# Patient Record
Sex: Female | Born: 1990 | Race: White | Hispanic: No | Marital: Married | Smoking: Never smoker
Health system: Southern US, Community
[De-identification: ages and names within clinical notes are randomized; demographics above are authoritative.]

## PROBLEM LIST (undated history)

## (undated) HISTORY — PX: CHOLECYSTECTOMY: SHX55

## (undated) HISTORY — PX: DENTAL SURGERY: SHX609

---

## 2012-12-14 ENCOUNTER — Emergency Department (INDEPENDENT_AMBULATORY_CARE_PROVIDER_SITE_OTHER)
Admission: EM | Admit: 2012-12-14 | Discharge: 2012-12-14 | Disposition: A | Discharge status: Home / Self Care | Payer: Self-pay | Source: Home / Self Care

## 2012-12-14 ENCOUNTER — Encounter (HOSPITAL_COMMUNITY): Payer: Self-pay | Admitting: Emergency Medicine

## 2012-12-14 DIAGNOSIS — H00039 Abscess of eyelid unspecified eye, unspecified eyelid: Secondary | ICD-10-CM

## 2012-12-14 DIAGNOSIS — H1089 Other conjunctivitis: Secondary | ICD-10-CM

## 2012-12-14 DIAGNOSIS — A499 Bacterial infection, unspecified: Secondary | ICD-10-CM

## 2012-12-14 DIAGNOSIS — H109 Unspecified conjunctivitis: Secondary | ICD-10-CM

## 2012-12-14 DIAGNOSIS — H00033 Abscess of eyelid right eye, unspecified eyelid: Secondary | ICD-10-CM

## 2012-12-14 MED ORDER — TOBRAMYCIN 0.3 % OP SOLN: 1.0000 [drp] | OPHTHALMIC | Status: AC

## 2012-12-14 MED ORDER — CEPHALEXIN 500 MG PO CAPS: 500.0000 mg | ORAL_CAPSULE | Freq: Four times a day (QID) | ORAL | Status: AC

## 2012-12-14 MED ORDER — CEFTRIAXONE SODIUM 1 G IJ SOLR
INTRAMUSCULAR | Status: AC
  Filled 2012-12-14: qty 10

## 2012-12-14 MED ORDER — CEFTRIAXONE SODIUM 1 G IJ SOLR
1.0000 g | Freq: Once | INTRAMUSCULAR | Status: AC
  Administered 2012-12-14: 1 g via INTRAMUSCULAR

## 2012-12-14 MED ORDER — HYDROCODONE-ACETAMINOPHEN 7.5-325 MG PO TABS: 1.0000 | ORAL_TABLET | ORAL | Status: AC | PRN

## 2012-12-14 MED ORDER — LIDOCAINE HCL (PF) 1 % IJ SOLN
INTRAMUSCULAR | Status: AC
  Filled 2012-12-14: qty 5

## 2012-12-14 NOTE — ED Provider Notes (Signed)
Medical screening examination/treatment/procedure(s) were performed by resident physician or non-physician practitioner and as supervising physician I was immediately available for consultation/collaboration.   Suvi Archuletta DOUGLAS MD.   Chrisoula Zegarra D Karleigh Bunte, MD 12/14/12 2029 

## 2012-12-14 NOTE — Discharge Instructions (Signed)
Bacterial Conjunctivitis  Bacterial conjunctivitis (pink eye) is caused by germs. These germs are spread from person to person (contagious). The white part of the eye may look red or pink. The eye may be irritated, watery, or have a thick discharge.   HOME CARE    To ease pain, apply a cool, clean washcloth over closed eyelids. Do this for 10 to 20 minutes, 3 to 4 times a day.   Gently wipe away any fluid coming from the eye with a warm, wet washcloth or cotton ball.   Wash your hands often with soap and water. Use paper towels to dry your hands.   Do not share towels or washcloths.   Change or wash your pillowcase every day.   Do not use eye makeup until the infection is gone.   Do not use machines or drive if your vision is blurry.   Stop using contact lenses. Do not use them again until your doctor says it is okay.   Do not touch the tip of the eye drop bottle or medicine tube with your fingers when you put medicine on the eye.   Use eye drops or medicated cream as told by your doctor.  GET HELP RIGHT AWAY IF:    Your eye is not better after 3 days of starting your medicine.   You have a yellowish fluid coming out of the eye.   You have more pain in the eye.   Your eye redness is spreading.   Your vision becomes blurry.   You have a temperature by mouth above 102 F (38.9 C), or as told by your doctor.   You have pain in the face, redness, or puffiness (swelling) around the eye.   You have problems with medicines you were given.  MAKE SURE YOU:    Understand these instructions.   Will watch this condition.   Will get help right away if you are not doing well or get worse.  Document Released: 05/17/2008 Document Revised: 02/07/2012 Document Reviewed: 05/17/2008  [employer] Patient Information 2013 [employer].

## 2012-12-14 NOTE — ED Notes (Addendum)
Pt c/o upper right eye swelling, below eyebrow, onset yest Sx include pain, localized fever, crusty eyes when she awoke this am.  Denies: f/v/d, inj/trauma.  Also reports having allergies yest Sx include: nasal congestion, runny nose Took antihistamine and most sx were relieved.   She is alert and oriented w/no signs of acute distress.

## 2012-12-14 NOTE — ED Provider Notes (Signed)
History     CSN: [id number]  Arrival date & time 12/14/12  1425   None     Chief Complaint  Patient presents with  . Eye Problem    (Consider location/radiation/quality/duration/timing/severity/associated sxs/prior treatment) HPI Comments: 22 year old female presents with pain in the right upper eyelid with swelling, redness of the conjunctiva in drainage in the eye. This began approximately 2 days ago approximately 3 or 4 days ago she to hair side of her upper eyelid. She denies visual problems.   History reviewed. No pertinent past medical history.  Past Surgical History  Procedure Laterality Date  . Cholecystectomy      No family history on file.  History  Substance Use Topics  . Smoking status: Never Smoker   . Smokeless tobacco: Not on file  . Alcohol Use: No    OB History   Grav Para Term Preterm Abortions TAB SAB Ect Mult Living                  Review of Systems  Constitutional: Negative.   HENT: Negative.   Eyes: Positive for photophobia, pain, discharge and redness. Negative for itching and visual disturbance.       Positive for pain  Respiratory: Negative.   Psychiatric/Behavioral: Negative.     Allergies  Review of patient's allergies indicates no known allergies.  Home Medications   Current Outpatient Rx  Name  Route  Sig  Dispense  Refill  . cephALEXin (KEFLEX) 500 MG capsule   Oral   Take 1 capsule (500 mg total) by mouth 4 (four) times daily. X 8 days   32 capsule   0   . tobramycin (TOBREX) 0.3 % ophthalmic solution   Right Eye   Place 1 drop into the right eye every 4 (four) hours.   5 mL   0     BP 140/95  Pulse 74  Temp(Src) 98.5 F (36.9 C) (Oral)  Resp 18  SpO2 100%  LMP 12/07/2012  Physical Exam  Nursing note and vitals reviewed. Constitutional: She appears well-developed and well-nourished. No distress.  Eyes: EOM are normal. Pupils are equal, round, and reactive to light.  Upper and lower conjunctival are  erythematous. Mild swelling of the lower lid. The upper eyelid with moderate swelling. There is approximately 20 cm of induration beneath the area for which she cut the hair. This area is erythematous. There is no.Harrold Donath Orbital erythema. Positive for purulent drainage. Sclera clear. A foreign body seen with magnification.    ED Course  Procedures (including critical care time)  Labs Reviewed - No data to display No results found.   1. Bacterial conjunctivitis of right eye   2. Cellulitis of eyelid, right       MDM  Warm compresses several times during the day. Tobramycin eye, solution one drop in right eye every 4 hours Keflex 500 mg 4 times a day for 7 days Recheck promptly for any new symptoms problems or worsening. Per any enlargement, increased pain problems with vision increased drainage or size of the palate infection increases recheck promptly. Rocephin 1 gm IM now Norco 7.5 q 4h prn pain  #12  Hayden Rasmussen, NP 12/14/12 6578  Hayden Rasmussen, NP 12/14/12 1726

## 2012-12-15 ENCOUNTER — Telehealth (HOSPITAL_COMMUNITY): Payer: Self-pay | Admitting: *Deleted

## 2012-12-15 NOTE — ED Notes (Signed)
Pt. called on VM and was concerned about diarrhea and her abscess turning darker. She said her instructions told her to seek care immediately if she had either of those symptoms.   I called and left a message to call. Pt. did not call back. I called again and told her if she was concerned she could come back tomorrow for a recheck between 11-5 PM or call back tomorrow. Cherly Anderson M

## 2016-09-15 ENCOUNTER — Encounter (HOSPITAL_COMMUNITY): Payer: Self-pay | Admitting: Emergency Medicine

## 2016-09-15 ENCOUNTER — Emergency Department (HOSPITAL_COMMUNITY): Payer: Commercial Managed Care - PPO

## 2016-09-15 ENCOUNTER — Emergency Department (HOSPITAL_COMMUNITY)
Admission: EM | Admit: 2016-09-15 | Discharge: 2016-09-15 | Disposition: A | Discharge status: Home / Self Care | Payer: Commercial Managed Care - PPO | Attending: Emergency Medicine | Admitting: Emergency Medicine

## 2016-09-15 DIAGNOSIS — Z23 Encounter for immunization: Secondary | ICD-10-CM | POA: Insufficient documentation

## 2016-09-15 DIAGNOSIS — L03116 Cellulitis of left lower limb: Secondary | ICD-10-CM | POA: Insufficient documentation

## 2016-09-15 DIAGNOSIS — S8992XA Unspecified injury of left lower leg, initial encounter: Secondary | ICD-10-CM | POA: Diagnosis present

## 2016-09-15 DIAGNOSIS — S8002XA Contusion of left knee, initial encounter: Secondary | ICD-10-CM | POA: Insufficient documentation

## 2016-09-15 DIAGNOSIS — Y999 Unspecified external cause status: Secondary | ICD-10-CM | POA: Insufficient documentation

## 2016-09-15 DIAGNOSIS — Y939 Activity, unspecified: Secondary | ICD-10-CM | POA: Diagnosis not present

## 2016-09-15 DIAGNOSIS — Y929 Unspecified place or not applicable: Secondary | ICD-10-CM | POA: Diagnosis not present

## 2016-09-15 DIAGNOSIS — W1839XA Other fall on same level, initial encounter: Secondary | ICD-10-CM | POA: Insufficient documentation

## 2016-09-15 MED ORDER — OXYCODONE-ACETAMINOPHEN 5-325 MG PO TABS
1.0000 | ORAL_TABLET | Freq: Once | ORAL | Status: AC
Start: 2016-09-15 — End: 2016-09-15
  Administered 2016-09-15: 1 via ORAL
  Filled 2016-09-15: qty 1

## 2016-09-15 MED ORDER — DOXYCYCLINE HYCLATE 100 MG PO CAPS: 100.0000 mg | ORAL_CAPSULE | Freq: Two times a day (BID) | ORAL | 0 refills | Status: AC

## 2016-09-15 MED ORDER — TETANUS-DIPHTH-ACELL PERTUSSIS 5-2.5-18.5 LF-MCG/0.5 IM SUSP
0.5000 mL | Freq: Once | INTRAMUSCULAR | Status: AC
  Administered 2016-09-15: 0.5 mL via INTRAMUSCULAR
  Filled 2016-09-15: qty 0.5

## 2016-09-15 MED ORDER — NAPROXEN 500 MG PO TABS: 500.0000 mg | ORAL_TABLET | Freq: Two times a day (BID) | ORAL | 0 refills | Status: AC | PRN

## 2016-09-15 MED ORDER — OXYCODONE-ACETAMINOPHEN 5-325 MG PO TABS: 1.0000 | ORAL_TABLET | Freq: Four times a day (QID) | ORAL | 0 refills | Status: AC | PRN

## 2016-09-15 NOTE — Discharge Instructions (Signed)
Wear knee sleeve for at least 2 weeks for stabilization of knee. Use crutches as needed for comfort. Ice and elevate knee throughout the day, using ice pack for no more than 20 minutes every hour. Alternate between naprosyn and percocet for pain relief. Do not drive or operate machinery with pain medication use. Start taking antibiotic as directed and until completed. Follow up with the orthopedist in 5-7 days for recheck of symptoms and ongoing management of your knee injury. Return to the ER for changes or worsening symptoms.  Keep wound clean with mild soap and water. Keep area covered with a topical antibiotic ointment and bandage, keep bandage dry. Monitor area for signs of infection to include, but not limited to: increasing pain, spreading redness, drainage/pus, worsening swelling, or fevers. Return to emergency department for emergent changing or worsening symptoms.

## 2016-09-15 NOTE — ED Provider Notes (Signed)
[facility]-EMERGENCY DEPT Provider Note   CSN: [id number] Arrival date & time: 09/15/16  0023     History   Chief Complaint Chief Complaint  Patient presents with  . Knee Injury    HPI Caroline Molina is a 26 y.o. female, who presents to the ED with complaints of L knee pain after a mechanical fall 5 days ago. Patient states that she tripped and slipped on ice 5 days ago causing her to strike her left knee on a rock, she had some soreness in her knee since then but was able to ambulate, but today her pain suddenly worsened when she got up from her desk. She describes the pain as more 8/10 intermittent sharp nonradiating left knee pain worse with movement and ambulation and unrelieved with ice, elevation, and Aleve. She also reports a bruise, mild swelling, and a puncture wound/abrasion over the left knee. She has noticed some serous drainage from the wound when she presses it, as well as some erythema that developed today around the wound. She is unsure of her last tetanus shot. She denies red streaking, purulent drainage, warmth to knee, fevers, chills, CP, SOB, abd pain, N/V/D/C, hematuria, dysuria, myalgias, back or neck pain, head inj/LOC, numbness, tingling, weakness, or any other complaints at this time. Denies possibility of pregnancy.    The history is provided by the patient and medical records. No language interpreter was used.  Knee Pain   This is a new problem. The current episode started more than 2 days ago. The problem occurs daily. The problem has been gradually worsening. The pain is present in the left knee. The quality of the pain is described as sharp. The pain is at a severity of 8/10. The pain is moderate. Pertinent negatives include no numbness, full range of motion and no tingling. The symptoms are aggravated by standing and activity. She has tried OTC pain medications and cold for the symptoms. The treatment provided no relief. There has been a history of trauma.     History reviewed. No pertinent past medical history.  There are no active problems to display for this patient.   Past Surgical History:  Procedure Laterality Date  . CHOLECYSTECTOMY    . DENTAL SURGERY      OB History    No data available       Home Medications    Prior to Admission medications   Medication Sig Start Date End Date Taking? Authorizing Provider  cephALEXin (KEFLEX) 500 MG capsule Take 1 capsule (500 mg total) by mouth 4 (four) times daily. X 8 days 12/14/12   Hayden Rasmussenavid Mabe, NP  HYDROcodone-acetaminophen (NORCO) 7.5-325 MG per tablet Take 1 tablet by mouth every 4 (four) hours as needed for pain. 12/14/12   Hayden Rasmussenavid Mabe, NP  tobramycin (TOBREX) 0.3 % ophthalmic solution Place 1 drop into the right eye every 4 (four) hours. 12/14/12   Hayden Rasmussenavid Mabe, NP    Family History No family history on file.  Social History Social History  Substance Use Topics  . Smoking status: Never Smoker  . Smokeless tobacco: Not on file  . Alcohol use No     Allergies   Penicillins and Vicodin [hydrocodone-acetaminophen]   Review of Systems Review of Systems  Constitutional: Negative for chills and fever.  HENT: Negative for facial swelling (no head inj).   Respiratory: Negative for shortness of breath.   Cardiovascular: Negative for chest pain.  Gastrointestinal: Negative for abdominal pain, constipation, diarrhea, nausea and vomiting.  Genitourinary: Negative for  dysuria and hematuria.  Musculoskeletal: Positive for arthralgias and joint swelling. Negative for back pain, myalgias and neck pain.  Skin: Positive for color change and wound.  Allergic/Immunologic: Negative for immunocompromised state.  Neurological: Negative for tingling, syncope, weakness and numbness.  Psychiatric/Behavioral: Negative for confusion.   10 Systems reviewed and are negative for acute change except as noted in the HPI.   Physical Exam Updated Vital Signs BP 131/74 (BP Location: Left Arm)    Pulse 74   Temp 98.3 F (36.8 C) (Oral)   Resp 16   Ht 5\' 6"  (1.676 m)   Wt 111.1 kg   LMP 09/09/2016   SpO2 100%   BMI 39.54 kg/m   Physical Exam  Constitutional: She is oriented to person, place, and time. Vital signs are normal. She appears well-developed and well-nourished.  Non-toxic appearance. No distress.  Afebrile, nontoxic, NAD  HENT:  Head: Normocephalic and atraumatic.  Mouth/Throat: Mucous membranes are normal.  Eyes: Conjunctivae and EOM are normal. Right eye exhibits no discharge. Left eye exhibits no discharge.  Neck: Normal range of motion. Neck supple.  Cardiovascular: Normal rate and intact distal pulses.   Pulmonary/Chest: Effort normal. No respiratory distress.  Abdominal: Normal appearance. She exhibits no distension.  Musculoskeletal:       Left knee: She exhibits swelling, ecchymosis, laceration (abrasion/puncture wound) and erythema. She exhibits normal range of motion, no effusion, no deformity, normal alignment, no LCL laxity, normal patellar mobility and no MCL laxity. Tenderness found. Medial joint line and lateral joint line tenderness noted.  L knee with FROM intact, with mild diffuse joint line TTP, small puncture wound to suprapatellar area with some mild swelling around the wound, mild erythema without warmth around wound, scant serous drainage expressible from wound but without purulent drainage. No effusion/deformity, small bruise to just below patella, no warmth, no abnormal alignment or patellar mobility, no varus/valgus laxity, neg anterior drawer test, no crepitus.  Strength and sensation grossly intact, distal pulses intact, compartments soft   Neurological: She is alert and oriented to person, place, and time. She has normal strength. No sensory deficit.  Skin: Skin is warm and dry. Abrasion and bruising noted. No rash noted.  Bruise and abrasion/puncture wound to L knee as mentioned above  Psychiatric: She has a normal mood and affect. Her  behavior is normal.  Nursing note and vitals reviewed.    ED Treatments / Results  Labs (all labs ordered are listed, but only abnormal results are displayed) Labs Reviewed - No data to display  EKG  EKG Interpretation None       Radiology Dg Knee Complete 4 Views Left  Result Date: 09/15/2016 CLINICAL DATA:  Initial evaluation for acute anterior knee pain status post fall 3 days ago. EXAM: LEFT KNEE - COMPLETE 4+ VIEW COMPARISON:  None. FINDINGS: No evidence of fracture, dislocation, or joint effusion. No evidence of arthropathy or other focal bone abnormality. Soft tissues are unremarkable. IMPRESSION: No acute osseous abnormality about the knee. Electronically Signed   By: Rise Mu M.D.   On: 09/15/2016 01:52    Procedures Procedures (including critical care time)  Medications Ordered in ED Medications  oxyCODONE-acetaminophen (PERCOCET/ROXICET) 5-325 MG per tablet 1 tablet (1 tablet Oral Given 09/15/16 0324)  Tdap (BOOSTRIX) injection 0.5 mL (0.5 mLs Intramuscular Given 09/15/16 0358)     Initial Impression / Assessment and Plan / ED Course  I have reviewed the triage vital signs and the nursing notes.  Pertinent labs & imaging results  that were available during my care of the patient were reviewed by me and considered in my medical decision making (see chart for details).     26 y.o. female here with L knee pain after mechanical fall 5 days ago; punctured knee on rock. Worse pain that began today, although she admits she was sore after the incident; ambulatory since incident. Noticed redness, swelling, and serous fluid drainage from puncture wound over knee. No red streaking, warmth, or fevers. On exam, NVI with soft compartments, FROM intact, mild tenderness diffusely throughout joint line, mild swelling near the puncture wound, serous drainage expressible without purulent drainage, minimally erythematous around wound, no warmth. Xray neg. Doesn't seem like  septic joint, more likely developing cellulitis around the wound on knee, but doesn't seem to actually go down to joint. Small bruise to knee, likely contusion. Will apply knee sleeve, give crutches; RICE encouraged; will start on doxy to cover for infection, strict return precautions advised. Will update Tdap. Will give pain meds as well; [place] reviewed prior to dispensing controlled substance medications, and was notable for: no narcotics/controlled substances in last 6 months. Risks/benefits/alternatives and expectations discussed regarding controlled substances. Side effects of medications discussed. Informed consent obtained. Discussed ortho f/up in 5-7 days for recheck. I explained the diagnosis and have given explicit precautions to return to the ER including for any other new or worsening symptoms. The patient understands and accepts the medical plan as it's been dictated and I have answered their questions. Discharge instructions concerning home care and prescriptions have been given. The patient is STABLE and is discharged to home in good condition.   Final Clinical Impressions(s) / ED Diagnoses   Final diagnoses:  Contusion of left knee, initial encounter  Cellulitis of left lower extremity    New Prescriptions New Prescriptions   DOXYCYCLINE (VIBRAMYCIN) 100 MG CAPSULE    Take 1 capsule (100 mg total) by mouth 2 (two) times daily. One po bid x 7 days   NAPROXEN (NAPROSYN) 500 MG TABLET    Take 1 tablet (500 mg total) by mouth 2 (two) times daily as needed for mild pain, moderate pain or headache (TAKE WITH MEALS.).   OXYCODONE-ACETAMINOPHEN (PERCOCET) 5-325 MG TABLET    Take 1 tablet by mouth every 6 (six) hours as needed for severe pain.       [id number] PA-C 09/15/16 0424    Cy Blamer, MD 09/15/16 [phone]

## 2016-09-15 NOTE — ED Triage Notes (Addendum)
Pt from home with complaints of left knee pain following falling hiking 5 days ago. Pt is ambulatory. Pt states her pain is 8/10 and feels like "jelly". Pt has slight swelling to the area and an area where the skin was broken. No deformity noted

## 2018-05-21 ENCOUNTER — Encounter (HOSPITAL_BASED_OUTPATIENT_CLINIC_OR_DEPARTMENT_OTHER): Payer: Self-pay

## 2018-05-21 ENCOUNTER — Emergency Department (HOSPITAL_BASED_OUTPATIENT_CLINIC_OR_DEPARTMENT_OTHER)
Admission: EM | Admit: 2018-05-21 | Discharge: 2018-05-21 | Disposition: A | Discharge status: Home / Self Care | Payer: Commercial Managed Care - PPO | Attending: Emergency Medicine | Admitting: Emergency Medicine

## 2018-05-21 ENCOUNTER — Emergency Department (HOSPITAL_BASED_OUTPATIENT_CLINIC_OR_DEPARTMENT_OTHER): Payer: Commercial Managed Care - PPO

## 2018-05-21 DIAGNOSIS — R1011 Right upper quadrant pain: Secondary | ICD-10-CM

## 2018-05-21 DIAGNOSIS — R1032 Left lower quadrant pain: Secondary | ICD-10-CM | POA: Diagnosis not present

## 2018-05-21 DIAGNOSIS — R109 Unspecified abdominal pain: Secondary | ICD-10-CM | POA: Diagnosis present

## 2018-05-21 LAB — URINALYSIS, MICROSCOPIC (REFLEX)

## 2018-05-21 LAB — CBC
HCT: 39 % (ref 36.0–46.0)
HEMOGLOBIN: 13.3 g/dL (ref 12.0–15.0)
MCH: 30.7 pg (ref 26.0–34.0)
MCHC: 34.1 g/dL (ref 30.0–36.0)
MCV: 90.1 fL (ref 78.0–100.0)
PLATELETS: 348 10*3/uL (ref 150–400)
RBC: 4.33 MIL/uL (ref 3.87–5.11)
RDW: 12.5 % (ref 11.5–15.5)
WBC: 12.9 10*3/uL — ABNORMAL HIGH (ref 4.0–10.5)

## 2018-05-21 LAB — COMPREHENSIVE METABOLIC PANEL
ALBUMIN: 3.5 g/dL (ref 3.5–5.0)
ALK PHOS: 86 U/L (ref 38–126)
ALT: 18 U/L (ref 0–44)
ANION GAP: 10 (ref 5–15)
AST: 18 U/L (ref 15–41)
BUN: 7 mg/dL (ref 6–20)
CHLORIDE: 102 mmol/L (ref 98–111)
CO2: 25 mmol/L (ref 22–32)
Calcium: 8.8 mg/dL — ABNORMAL LOW (ref 8.9–10.3)
Creatinine, Ser: 0.7 mg/dL (ref 0.44–1.00)
GFR calc non Af Amer: >60 mL/min (ref >60)
GLUCOSE: 96 mg/dL (ref 70–99)
POTASSIUM: 3.9 mmol/L (ref 3.5–5.1)
SODIUM: 137 mmol/L (ref 135–145)
Total Bilirubin: 0.9 mg/dL (ref 0.3–1.2)
Total Protein: 7.3 g/dL (ref 6.5–8.1)

## 2018-05-21 LAB — URINALYSIS, ROUTINE W REFLEX MICROSCOPIC
BILIRUBIN URINE: NEGATIVE
GLUCOSE, UA: NEGATIVE mg/dL
KETONES UR: NEGATIVE mg/dL
Nitrite: NEGATIVE
PH: 6.5 (ref 5.0–8.0)
Protein, ur: NEGATIVE mg/dL

## 2018-05-21 LAB — PREGNANCY, URINE: PREG TEST UR: NEGATIVE

## 2018-05-21 LAB — WET PREP, GENITAL
Sperm: NONE SEEN
TRICH WET PREP: NONE SEEN
YEAST WET PREP: NONE SEEN

## 2018-05-21 LAB — LIPASE, BLOOD: Lipase: 24 U/L (ref 11–51)

## 2018-05-21 MED ORDER — KETOROLAC TROMETHAMINE 15 MG/ML IJ SOLN
15.0000 mg | Freq: Once | INTRAMUSCULAR | Status: AC
  Administered 2018-05-21: 15 mg via INTRAVENOUS
  Filled 2018-05-21: qty 1

## 2018-05-21 MED ORDER — DICYCLOMINE HCL 20 MG PO TABS: 20.0000 mg | ORAL_TABLET | Freq: Two times a day (BID) | ORAL | 0 refills | Status: AC

## 2018-05-21 MED ORDER — SODIUM CHLORIDE 0.9 % IV BOLUS
1000.0000 mL | Freq: Once | INTRAVENOUS | Status: AC
  Administered 2018-05-21: 1000 mL via INTRAVENOUS

## 2018-05-21 MED ORDER — FENTANYL CITRATE (PF) 100 MCG/2ML IJ SOLN
50.0000 ug | Freq: Once | INTRAMUSCULAR | Status: AC
  Administered 2018-05-21: 50 ug via INTRAVENOUS
  Filled 2018-05-21: qty 2

## 2018-05-21 MED ORDER — METRONIDAZOLE 500 MG PO TABS: 500.0000 mg | ORAL_TABLET | Freq: Two times a day (BID) | ORAL | 0 refills | Status: AC

## 2018-05-21 MED ORDER — FENTANYL CITRATE (PF) 100 MCG/2ML IJ SOLN
25.0000 ug | Freq: Once | INTRAMUSCULAR | Status: AC
  Administered 2018-05-21: 25 ug via INTRAVENOUS
  Filled 2018-05-21: qty 2

## 2018-05-21 MED ORDER — IOPAMIDOL (ISOVUE-300) INJECTION 61%
100.0000 mL | Freq: Once | INTRAVENOUS | Status: AC | PRN
  Administered 2018-05-21: 100 mL via INTRAVENOUS

## 2018-05-21 NOTE — ED Triage Notes (Signed)
Pt states no solid bm in 2weeks, having RUQ pain and LLQ pain; sent here from [facility]

## 2018-05-21 NOTE — ED Notes (Signed)
Pt verbalizes understanding of d/c instructions and denies any further needs at this time. 

## 2018-05-21 NOTE — ED Provider Notes (Signed)
[facility] Emergency Department Provider Note MRN:  [id number]  Arrival date & time: 05/21/18     Chief Complaint   Abdominal Pain   History of Present Illness   Caroline Molina is a 27 y.o. year-old female with a history of cholecystectomy presenting to the ED with chief complaint of abdominal pain.  Pain began gradually 2 weeks ago, located in the right upper quadrant and left lower quadrant.  Pain became more severe 3 to 4 days ago.  910 severity, constant, associated with loose stools.  Denies fever, no chest pain or shortness of breath.  No exacerbating or relieving factors.  Review of Systems  A complete 10 system review of systems was obtained and all systems are negative except as noted in the HPI and PMH.   Patient's Health History   History reviewed. No pertinent past medical history.  Past Surgical History:  Procedure Laterality Date  . CHOLECYSTECTOMY    . DENTAL SURGERY      No family history on file.  Social History   Socioeconomic History  . Marital status: Married    Spouse name: Not on file  . Number of children: Not on file  . Years of education: Not on file  . Highest education level: Not on file  Occupational History  . Not on file  Social Needs  . Financial resource strain: Not on file  . Food insecurity:    Worry: Not on file    Inability: Not on file  . Transportation needs:    Medical: Not on file    Non-medical: Not on file  Tobacco Use  . Smoking status: Never Smoker  . Smokeless tobacco: Never Used  Substance and Sexual Activity  . Alcohol use: No  . Drug use: No  . Sexual activity: Yes    Birth control/protection: Condom  Lifestyle  . Physical activity:    Days per week: Not on file    Minutes per session: Not on file  . Stress: Not on file  Relationships  . Social connections:    Talks on phone: Not on file    Gets together: Not on file    Attends religious service: Not on file    Active member of  club or organization: Not on file    Attends meetings of clubs or organizations: Not on file    Relationship status: Not on file  . Intimate partner violence:    Fear of current or ex partner: Not on file    Emotionally abused: Not on file    Physically abused: Not on file    Forced sexual activity: Not on file  Other Topics Concern  . Not on file  Social History Narrative  . Not on file     Physical Exam  Vital Signs and Nursing Notes reviewed Vitals:   05/21/18 1012 05/21/18 1443  BP: 113/79 127/84  Pulse: 77 76  Resp: 18 16  Temp: 98.2 F (36.8 C) 99.2 F (37.3 C)  SpO2: 99% 100%    CONSTITUTIONAL: Well-appearing, NAD NEURO:  Alert and oriented x 3, no focal deficits EYES:  eyes equal and reactive ENT/NECK:  no LAD, no JVD CARDIO: Regular rate, well-perfused, normal S1 and S2 PULM:  CTAB no wheezing or rhonchi GI/GU:  normal bowel sounds, non-distended, moderate left lower quadrant tenderness to palpation; normal-appearing external genitalia, no adnexal masses, no cervical motion tenderness, moderate amount of white/yellow discharge in the vaginal vault.  Chaperoned by female nurse. MSK/SPINE:  No gross deformities, no edema SKIN:  no rash, atraumatic PSYCH:  Appropriate speech and behavior  Diagnostic and Interventional Summary    EKG Interpretation  Date/Time:    Ventricular Rate:    PR Interval:    QRS Duration:   QT Interval:    QTC Calculation:   R Axis:     Text Interpretation:        Labs Reviewed  WET PREP, GENITAL - Abnormal; Notable for the following components:      Result Value   Clue Cells Wet Prep HPF POC PRESENT (*)    WBC, Wet Prep HPF POC MANY (*)    All other components within normal limits  CBC - Abnormal; Notable for the following components:   WBC 12.9 (*)    All other components within normal limits  COMPREHENSIVE METABOLIC PANEL - Abnormal; Notable for the following components:   Calcium 8.8 (*)    All other components within  normal limits  URINALYSIS, ROUTINE W REFLEX MICROSCOPIC - Abnormal; Notable for the following components:   APPearance HAZY (*)    Specific Gravity, Urine <1.005 (*)    Hgb urine dipstick TRACE (*)    Leukocytes, UA SMALL (*)    All other components within normal limits  URINALYSIS, MICROSCOPIC (REFLEX) - Abnormal; Notable for the following components:   Bacteria, UA MANY (*)    All other components within normal limits  LIPASE, BLOOD  PREGNANCY, URINE  GC/CHLAMYDIA PROBE AMP ([place]) NOT AT [facility]    DG Chest 2 View  Final Result    CT ABDOMEN PELVIS W CONTRAST  Final Result      Medications  sodium chloride 0.9 % bolus 1,000 mL (0 mLs Intravenous Stopped 05/21/18 1320)  fentaNYL (SUBLIMAZE) injection 25 mcg (25 mcg Intravenous Given 05/21/18 1220)  iopamidol (ISOVUE-300) 61 % injection 100 mL (100 mLs Intravenous Contrast Given 05/21/18 1311)  fentaNYL (SUBLIMAZE) injection 50 mcg (50 mcg Intravenous Given 05/21/18 1447)  ketorolac (TORADOL) 15 MG/ML injection 15 mg (15 mg Intravenous Given 05/21/18 1447)     Procedures Critical Care  ED Course and Medical Decision Making  I have reviewed the triage vital signs and the nursing notes.  Pertinent labs & imaging results that were available during my care of the patient were reviewed by me and considered in my medical decision making (see below for details).    Favoring constipation versus diverticulitis in this 27 year old female with recent worsening left lower quadrant pain, change to bowel habits.  No fevers, history and physical inconsistent with ovarian torsion, no vaginal bleeding or discharge.  CT pending.  CT with nonspecific findings, question of enteritis.  Some of free fluid that is also nonspecific.  Pelvic exam with nothing to suggest PID, no tenderness, moderate amount of discharge.  Wet prep clue cells suggesting bacterial vaginosis.  Will treat with metronidazole, also prescription for Bentyl for pain.  Provided  reassurance.  Patient also endorsing continued right upper quadrant pain.  Without explanation on CT, chest x-ray was also performed which was unremarkable.  Patient is without tachycardia, no evidence of DVT, PERC negative.  After the discussed management above, the patient was determined to be safe for discharge.  The patient was in agreement with this plan and all questions regarding their care were answered.  ED return precautions were discussed and the patient will return to the ED with any significant worsening of condition.  Elmer Sow. Pilar Plate, MD [facility]  [email or web address]  Final Clinical Impressions(s) / ED Diagnoses     ICD-10-CM   1. Left lower quadrant pain R10.32   2. RUQ pain R10.11 DG Chest 2 View    DG Chest 2 View    ED Discharge Orders         Ordered    dicyclomine (BENTYL) 20 MG tablet  2 times daily     05/21/18 1609    metroNIDAZOLE (FLAGYL) 500 MG tablet  2 times daily     05/21/18 1609             Sabas Sous, MD 05/21/18 [id number]

## 2018-05-21 NOTE — Discharge Instructions (Signed)
You were evaluated in the Emergency Department and after careful evaluation, we did not find any emergent condition requiring admission or further testing in the hospital.  Your symptoms today seem to be due to bacterial vaginosis.  You may also have inflammation of your intestines due to a virus.  Please take the medications provided as directed and follow-up with your regular doctor.  Please return to the Emergency Department if you experience any worsening of your condition.  We encourage you to follow up with a primary care provider.  Thank you for allowing [place] to be a part of your care.

## 2018-05-22 LAB — GC/CHLAMYDIA PROBE AMP (~~LOC~~) NOT AT ARMC
CHLAMYDIA, DNA PROBE: POSITIVE — AB
NEISSERIA GONORRHEA: NEGATIVE

## 2018-05-24 ENCOUNTER — Telehealth: Payer: Self-pay | Admitting: Medical

## 2018-05-24 DIAGNOSIS — A749 Chlamydial infection, unspecified: Secondary | ICD-10-CM

## 2018-05-24 MED ORDER — AZITHROMYCIN 250 MG PO TABS: 1000.0000 mg | ORAL_TABLET | Freq: Once | ORAL | 0 refills | Status: AC

## 2018-05-24 NOTE — Telephone Encounter (Addendum)
Caroline Molina tested positive for  Chlamydia. Patient was called by RN and allergies and pharmacy confirmed. Rx sent to pharmacy of choice.   Marny Lowenstein, PA-C 05/24/2018 10:28 AM       ----- Message from Kathe Becton, RN sent at 05/24/2018 10:12 AM EDT ----- This patient tested positive for :  chlamydia  She,"is allergic to Penicillin and Vicodin" I have informed the patient of her results and confirmed her pharmacy is correct in her chart. Please send Rx.   Thank you,   Kathe Becton, RN   Results faxed to [facility] Department.

## 2019-06-21 IMAGING — CT CT ABD-PELV W/ CM
2 of 4 series · 17 of 46 positions shown, 19 images · IV contrast (APPLIED)
Comparison: None.

CLINICAL DATA: Abdominal pain with diverticulitis suspected

EXAM:
CT ABDOMEN AND PELVIS WITH CONTRAST
TECHNIQUE: Multidetector CT imaging of the abdomen and pelvis was performed
using the standard protocol following bolus administration of
intravenous contrast.
CONTRAST:  100mL [id number] IOPAMIDOL (WT09HN-3WW) INJECTION 61%

[Series 2: axial st · axial · 0.79mm/px · z∈[-518,-58]mm · 14 of 100 slices shown, 16 images]
[im 4/100  soft-tissue]
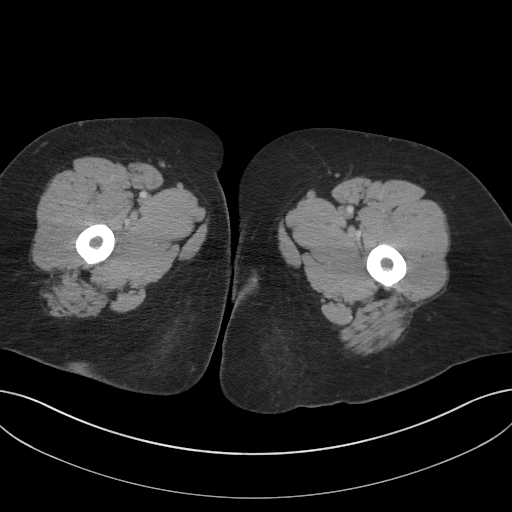
[im 4/100  bone]
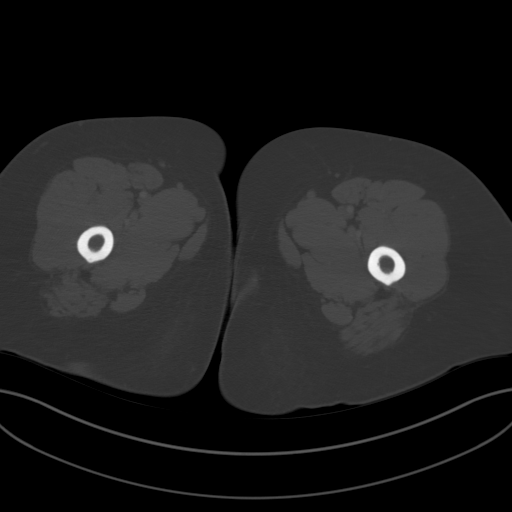
[im 12/100  soft-tissue]
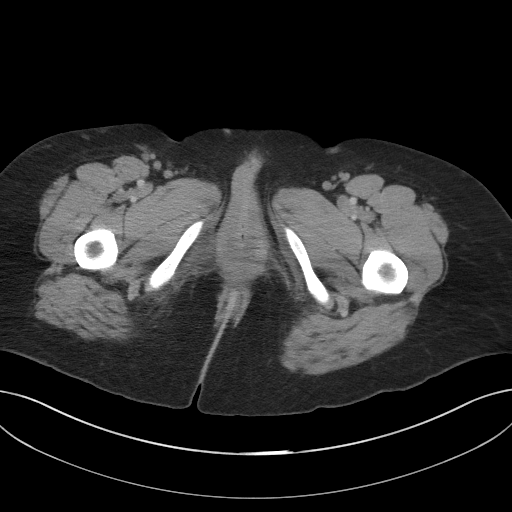
[im 20/100  soft-tissue]
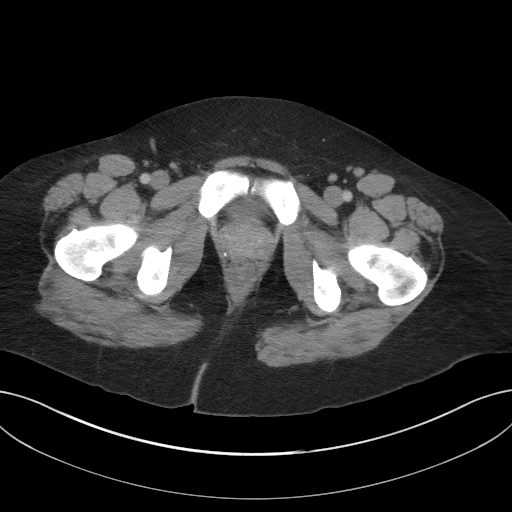
[im 27/100  soft-tissue]
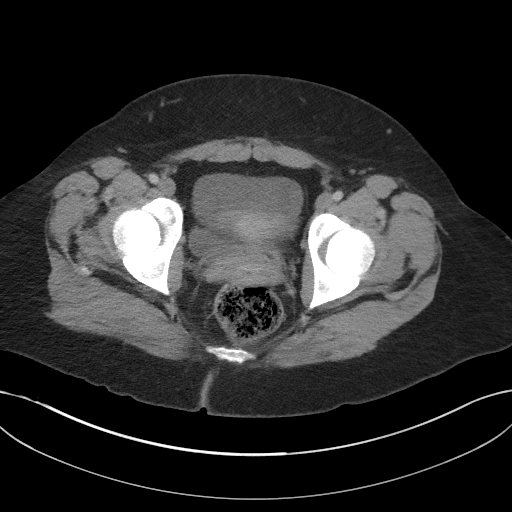
[im 35/100  soft-tissue]
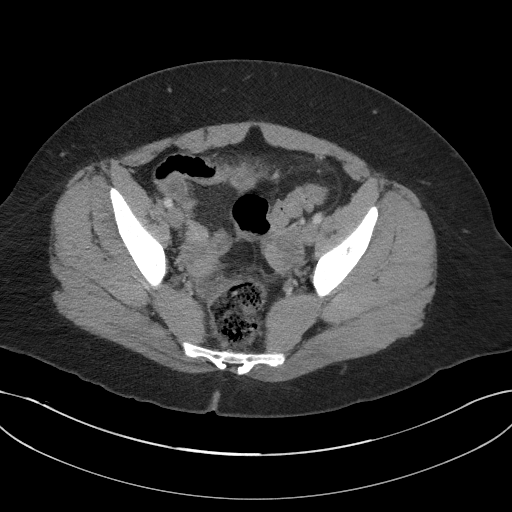
[im 39/100  soft-tissue]
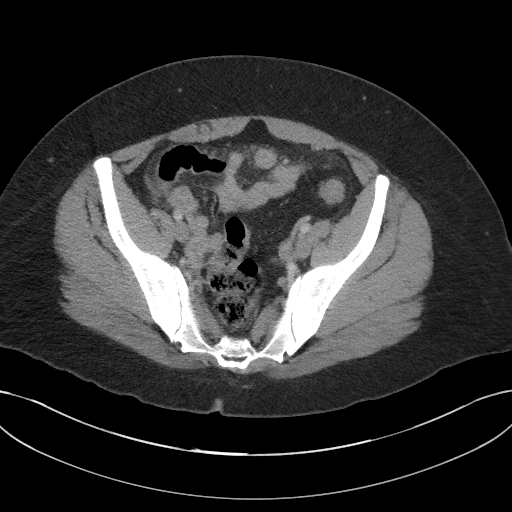
[im 46/100  soft-tissue]
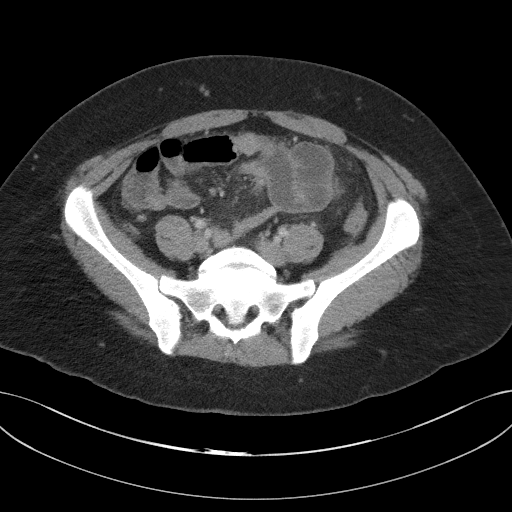
[im 54/100  soft-tissue]
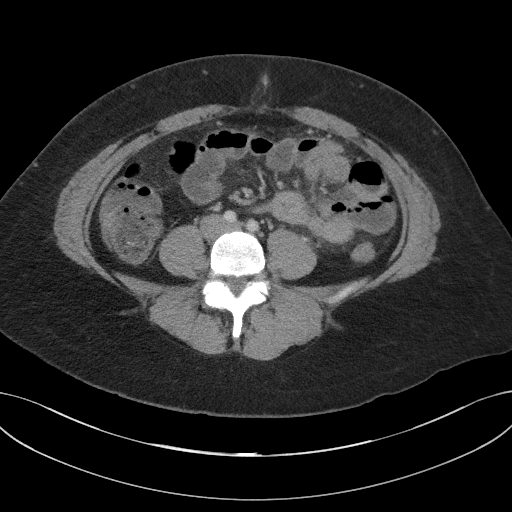
[im 61/100  soft-tissue]
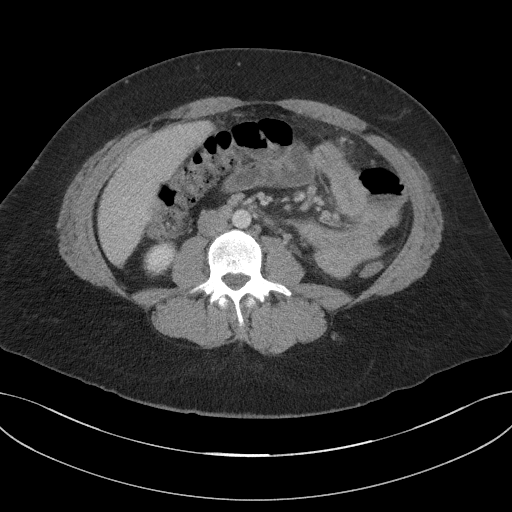
[im 61/100  bone]
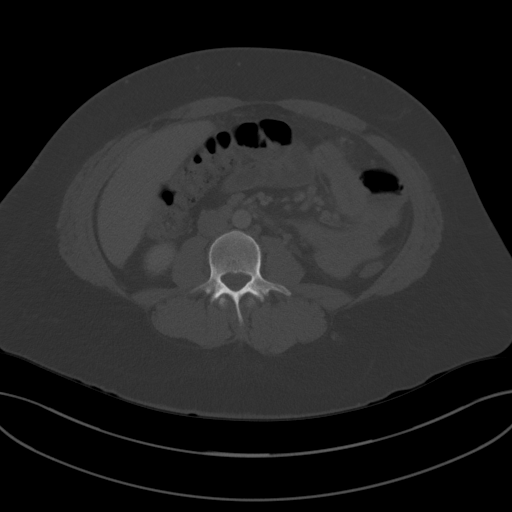
[im 65/100  soft-tissue]
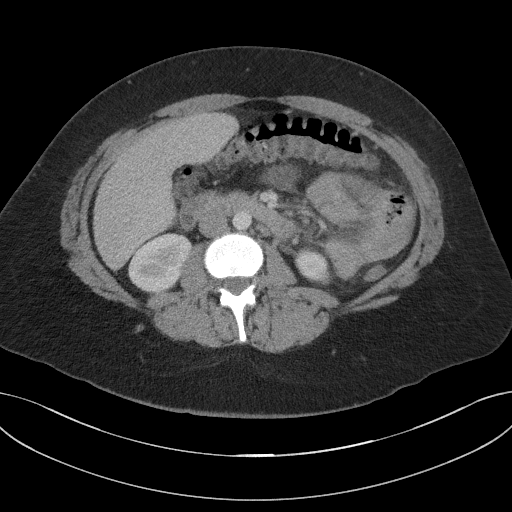
[im 73/100  soft-tissue]
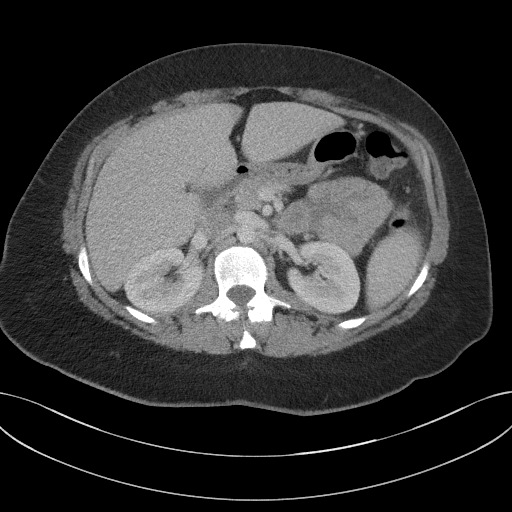
[im 80/100  soft-tissue]
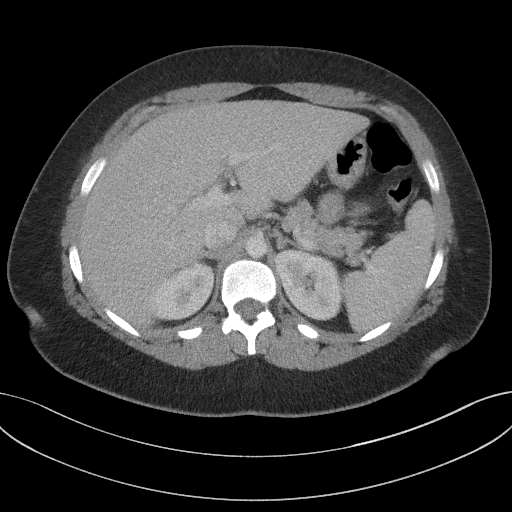
[im 88/100  soft-tissue]
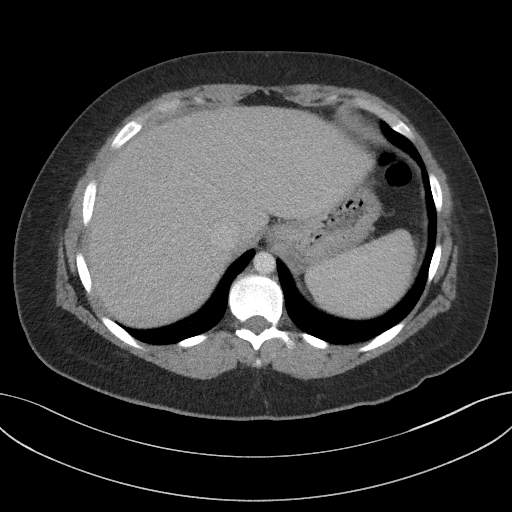
[im 96/100  soft-tissue]
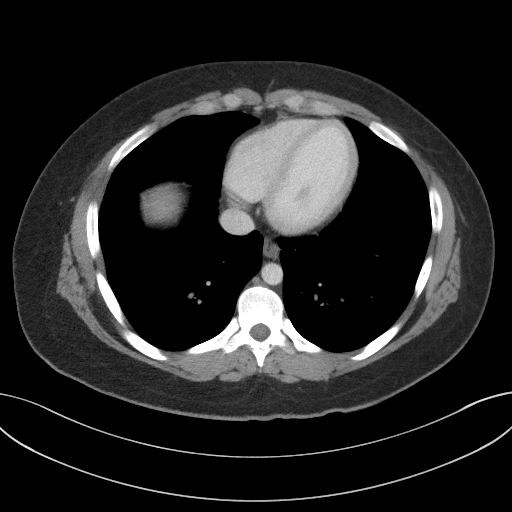

[Series 4: coronal st · coronal · 0.93mm/px · 3 of 108 slices shown]
[im 36/108  soft-tissue]
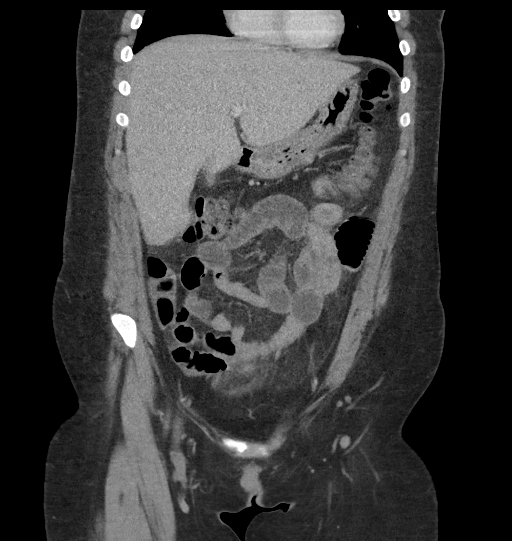
[im 48/108  soft-tissue]
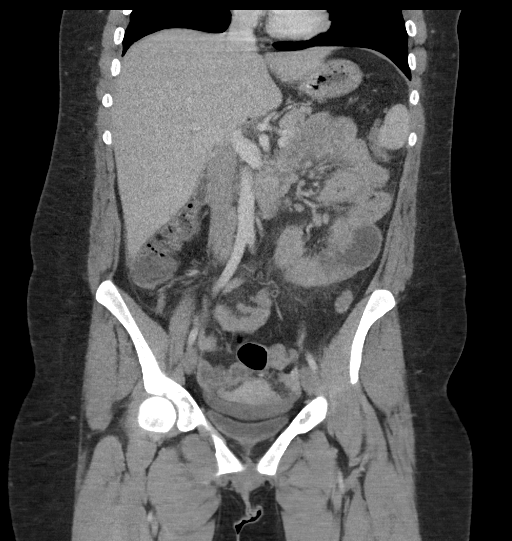
[im 60/108  soft-tissue]
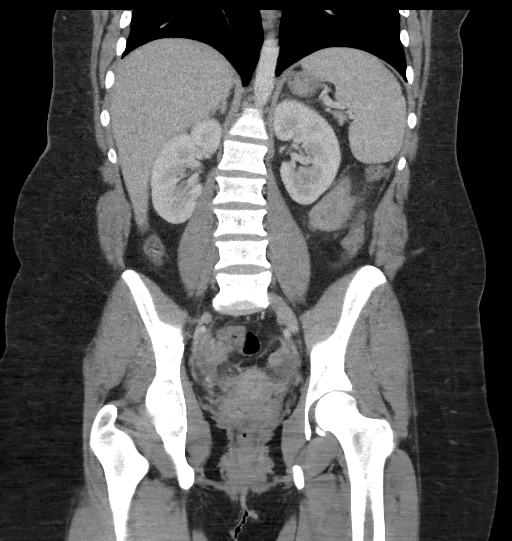

[17 of 46 positions shown; findings below may reference images not displayed]

FINDINGS: Lower chest:  No acute finding

Hepatobiliary: No focal liver abnormality.No evidence of biliary
obstruction or stone.

Pancreas: Unremarkable.

Spleen: Unremarkable.

Adrenals/Urinary Tract: Negative adrenals. No hydronephrosis or
stone. Unremarkable bladder.

Stomach/Bowel:  No obstruction. No focal bowel wall thickening.

Vascular/Lymphatic: No acute vascular abnormality. Prominent lymph
nodes in the small bowel mesentery.

Reproductive:No evident hydrosalpinx. 2 cm cystic structure at the
left adnexa without prominent rim enhancement, presumably a
follicle.

Other: Fat stranding in the pelvis involving the omentum or
mesentery, but not discrete as seen with infarct. Small volume non
loculated ascites in the pelvis without detected peritoneal
enhancement. The fluid is low-density, arguing against recent pelvic
hemorrhage.

Musculoskeletal: L5 chronic bilateral pars defects with focal disc
degeneration and anterolisthesis causing biforaminal impingement.
IMPRESSION: 1. Nonspecific fat inflammation with small volume ascites in the
pelvis. Question mild small bowel thickening and mesenteric edema in
the left upper quadrant, with prominent mesenteric nodes, are other
symptoms of enteritis? PID is an alternate consideration.
2. L5 chronic bilateral pars defects with L5-S1 disc degeneration
and biforaminal impingement.

## 2019-06-21 IMAGING — CR DG CHEST 2V
2 series · 2 of 2 positions shown · non-contrast
Comparison: CT 05/21/2018

CLINICAL DATA: Pain below the right breast

EXAM:
CHEST - 2 VIEW

[w chest pa]
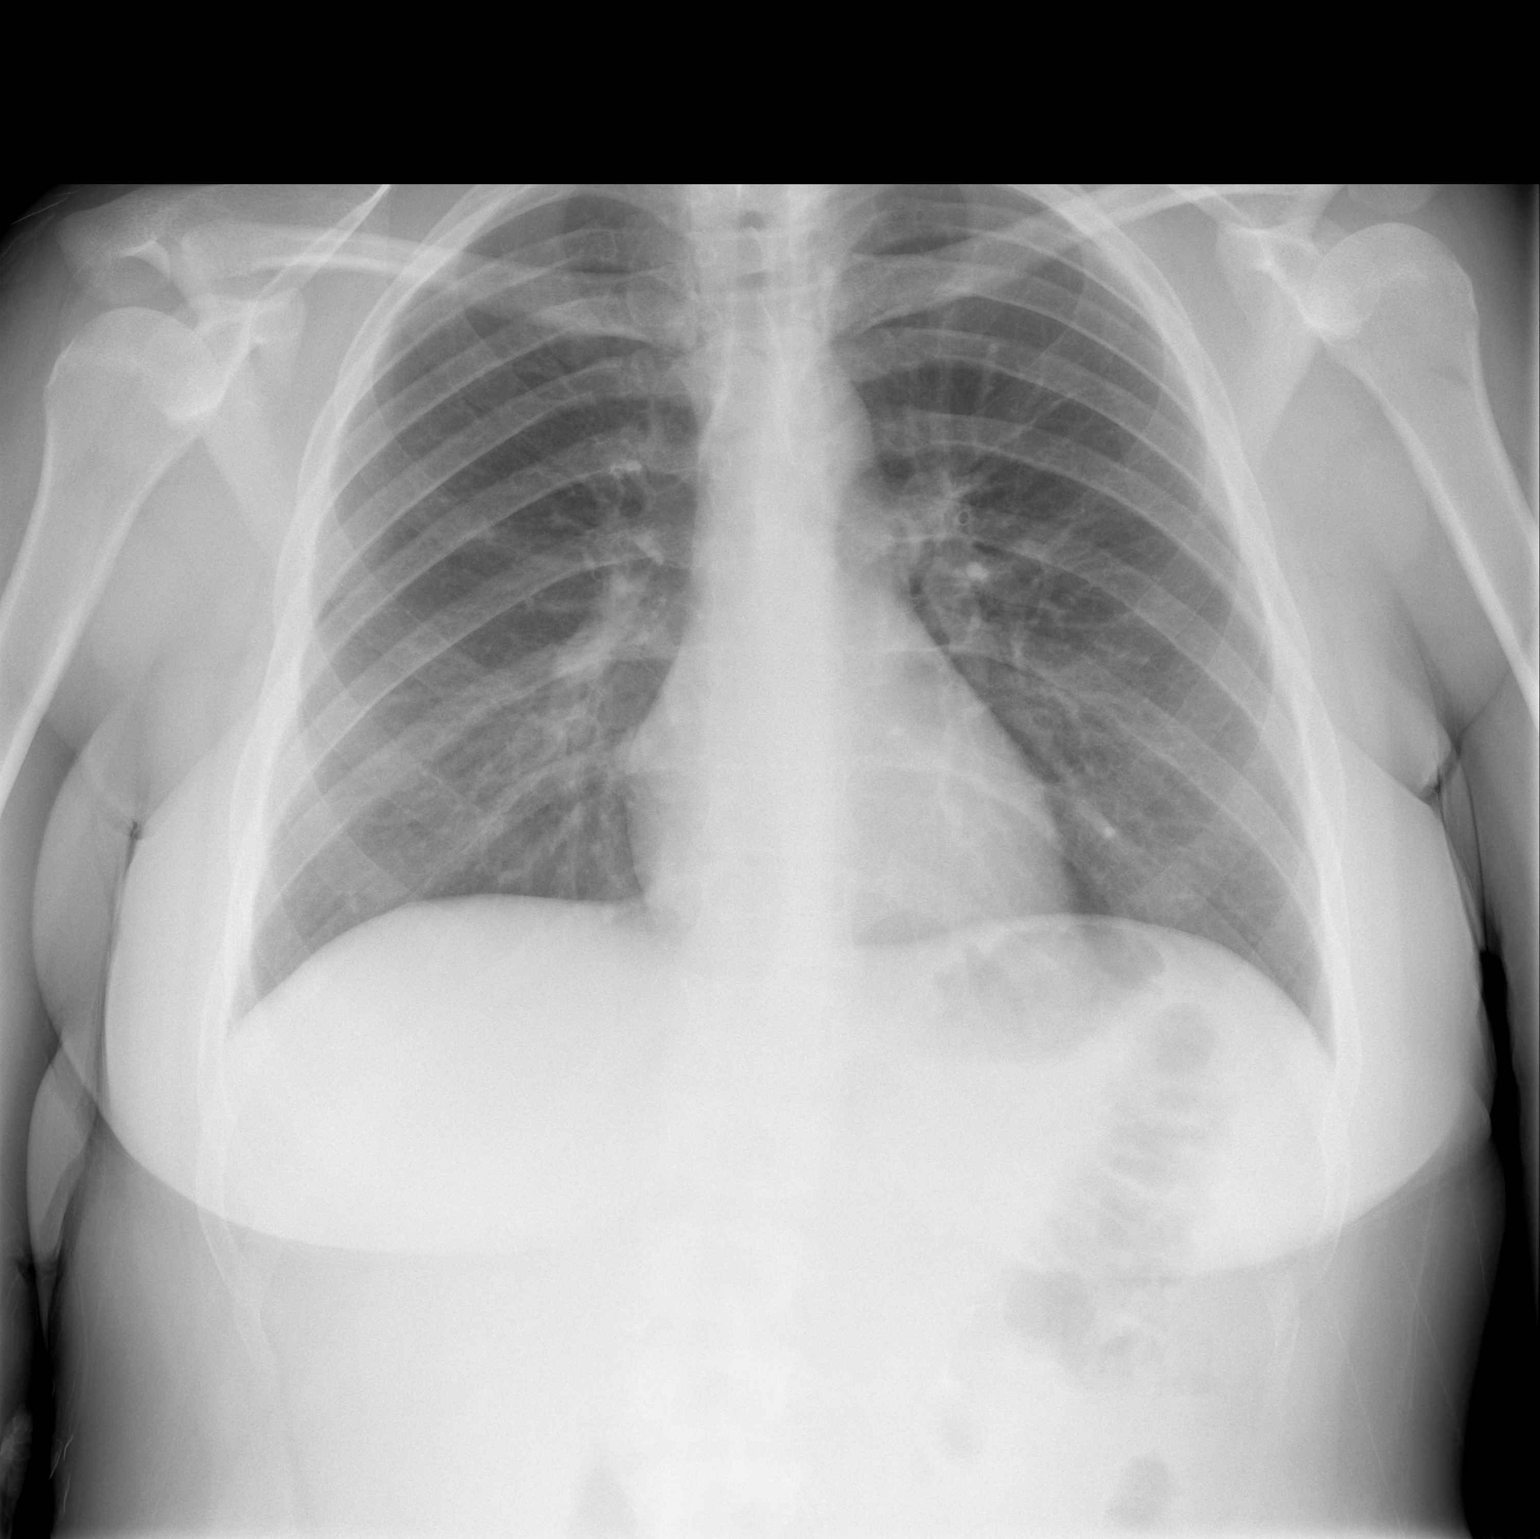

[w chest lat]
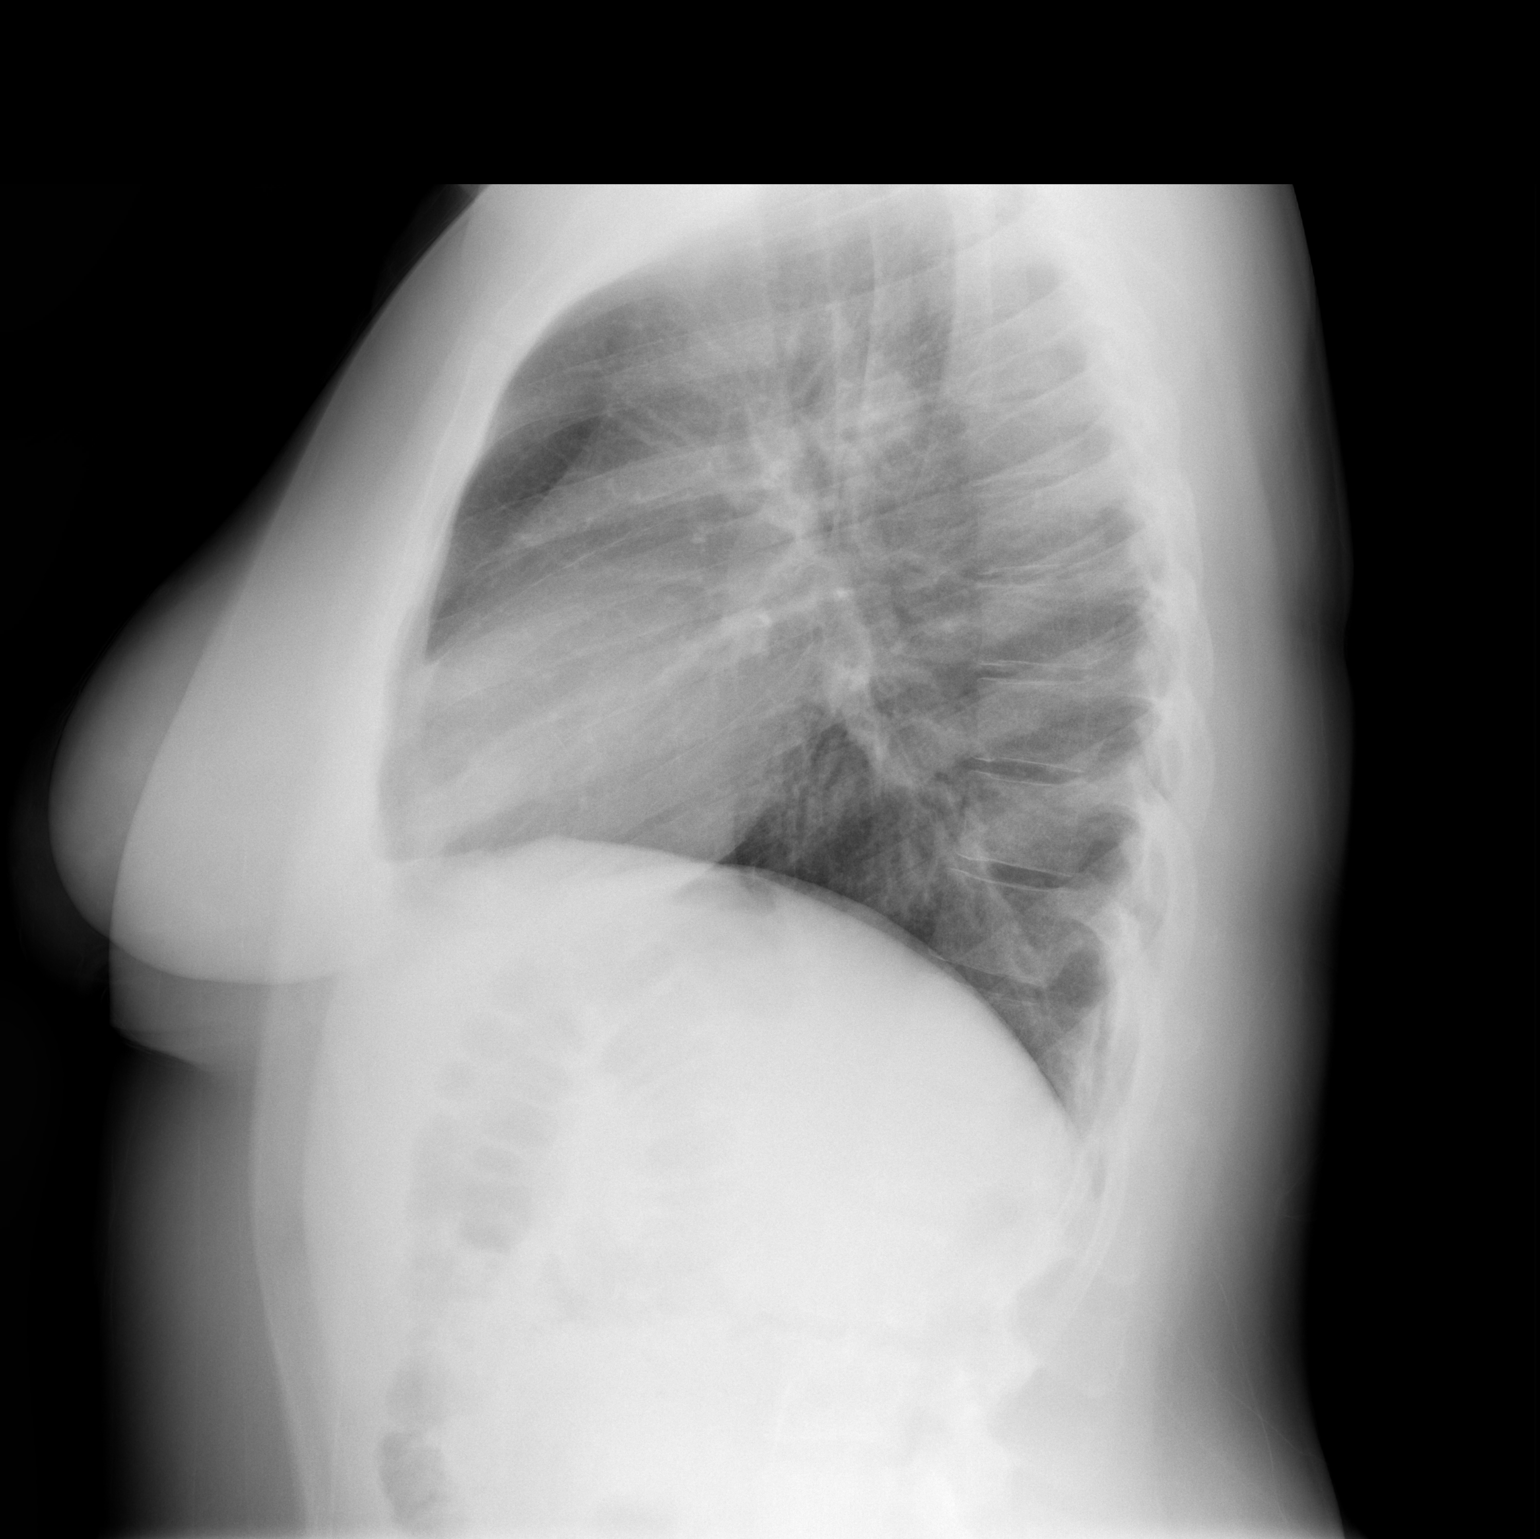

[2 of 2 positions shown; findings below may reference images not displayed]

FINDINGS: The heart size and mediastinal contours are within normal limits.
Both lungs are clear. The visualized skeletal structures are
unremarkable.
IMPRESSION: No active cardiopulmonary disease.
